# Patient Record
Sex: Female | Born: 1970 | Race: White | Hispanic: No | Marital: Single | Smoking: Former smoker
Health system: Southern US, Community
[De-identification: ages and names within clinical notes are randomized; demographics above are authoritative.]

## PROBLEM LIST (undated history)

## (undated) DIAGNOSIS — E039 Hypothyroidism, unspecified: Secondary | ICD-10-CM

## (undated) HISTORY — DX: Hypothyroidism, unspecified: E03.9

## (undated) HISTORY — PX: THYROIDECTOMY: SHX17

---

## 2003-11-20 ENCOUNTER — Ambulatory Visit (HOSPITAL_BASED_OUTPATIENT_CLINIC_OR_DEPARTMENT_OTHER): Admission: RE | Admit: 2003-11-20 | Discharge: 2003-11-20 | Payer: Self-pay | Admitting: Orthopedic Surgery

## 2010-01-29 ENCOUNTER — Encounter: Admission: RE | Admit: 2010-01-29 | Discharge: 2010-01-29 | Payer: Self-pay | Admitting: Orthopedic Surgery

## 2010-06-29 ENCOUNTER — Encounter: Payer: Self-pay | Admitting: Orthopedic Surgery
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
## 2011-05-28 IMAGING — CT CT EXTREM LOW W/O CM*R*
1 of 5 series · 2 of 14 positions shown, 3 images · non-contrast
Comparison: Report of right ankle MRI dated 12/03/2009

CLINICAL DATA: Right ankle pain.  Previous open reduction and

CT OF THE RIGHT ANKLE WITHOUT CONTRAST
TECHNIQUE: Multidetector CT imaging of the right ankle was
performed according to the standard protocol without intravenous
contrast. Multiplanar CT image reconstructions were also generated.

[Series 4: lower ext detail · axial · 0.31mm/px · z∈[-6,+56]mm · 2 of 77 slices shown, 3 images]
[im 26/77  soft-tissue]
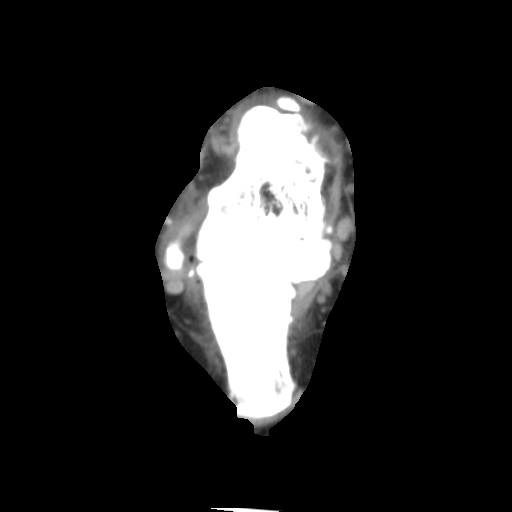
[im 26/77  bone]
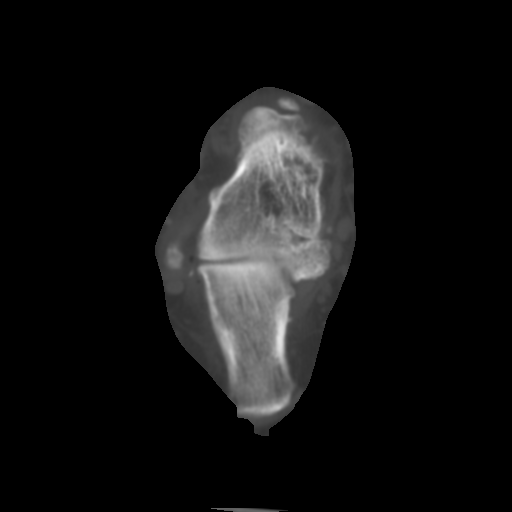
[im 51/77  bone]
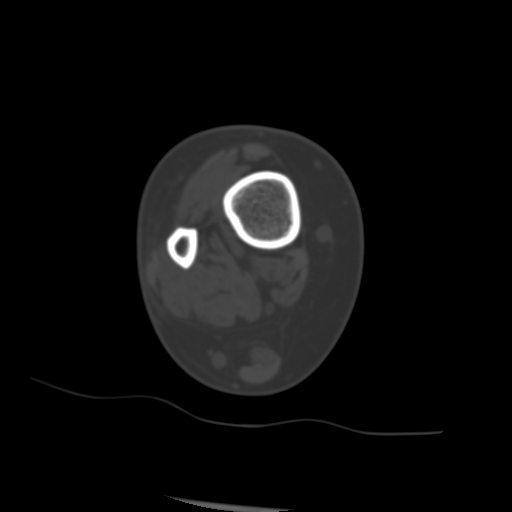

[2 of 14 positions shown; findings below may reference images not displayed]

FINDINGS: The scan demonstrates severe osteoarthritis of the
posterior and middle subtalar joint facets with prominent spur
formation, joint space narrowing, and subchondral sclerosis.  There
also moderate osteoarthritic changes of the small anterior facets
of the subtalar joint.

There is prominent dorsal spurring of the calcaneus at the
calcaneonavicular joint.

There is severe osteoarthritis of the talonavicular joint.  There
are prominent osteophytes on the dorsal aspect of the talonavicular
joint.

There are small calcifications consistent with loose bodies
adjacent to the posterior tibial and flexor digitorum longus
tendons at the posterior medial aspect of the distal tibia.  There
is also a tiny calcification adjacent to the peroneal tendons at
the posterior aspect of the lateral malleolus.

The Achilles tendon is normal.  There is a prominent enthesophyte
at the plantar fascia origin.

There is slight osteoarthritis of the posterior aspect of the
tibiotalar joint primarily associated with the prominent posterior
process of the talus which could lead to impingement with marked
plantar flexion.  The remainder ankle joint demonstrates no
significant joint space narrowing.
IMPRESSION: 1.  Severe osteoarthritis of the subtalar joint.
2.  Severe osteoarthritis of the talonavicular joint with mild
osteoarthritis of the calcaneal cuboid joint.
3.  Loose bodies in the posterior medial aspect of the ankle joint
adjacent to the posterior tibial and flexor digitorum longus
tendons.

## 2015-02-28 ENCOUNTER — Encounter: Payer: Self-pay | Admitting: "Endocrinology

## 2015-03-11 ENCOUNTER — Ambulatory Visit (INDEPENDENT_AMBULATORY_CARE_PROVIDER_SITE_OTHER): Payer: BLUE CROSS/BLUE SHIELD | Admitting: "Endocrinology

## 2015-03-11 ENCOUNTER — Encounter: Payer: Self-pay | Admitting: "Endocrinology

## 2015-03-11 VITALS — BP 134/83 | HR 83 | Ht 66.0 in | Wt 237.0 lb

## 2015-03-11 DIAGNOSIS — E039 Hypothyroidism, unspecified: Secondary | ICD-10-CM | POA: Diagnosis not present

## 2015-03-11 DIAGNOSIS — E890 Postprocedural hypothyroidism: Secondary | ICD-10-CM

## 2015-03-11 DIAGNOSIS — R7302 Impaired glucose tolerance (oral): Secondary | ICD-10-CM

## 2015-03-11 NOTE — Progress Notes (Signed)
Subjective:    Patient ID: Leah Dawson, female    DOB: [birth year],    Past Medical History  Diagnosis Date  . Hypothyroidism    Past Surgical History  Procedure Laterality Date  . Thyroidectomy     Social History   Social History  . Marital Status: Single    Spouse Name: N/A  . Number of Children: N/A  . Years of Education: N/A   Social History Main Topics  . Smoking status: Former Research scientist (life sciences)  . Smokeless tobacco: None  . Alcohol Use: No  . Drug Use: No  . Sexual Activity: Not Asked   Other Topics Concern  . None   Social History Narrative  . None   Outpatient Encounter Prescriptions as of 03/11/2015  Medication Sig  . ALPRAZolam (XANAX) 0.5 MG tablet Take 0.5 mg by mouth at bedtime as needed for anxiety.  . calcitRIOL (ROCALTROL) 0.5 MCG capsule Take 0.5 mcg by mouth daily.  . calcium acetate (PHOSLO) 667 MG capsule Take 667 mg by mouth 3 (three) times daily with meals.  Marland Kitchen levothyroxine (SYNTHROID, LEVOTHROID) 175 MCG tablet Take 175 mcg by mouth daily before breakfast.  . metFORMIN (GLUCOPHAGE) 500 MG tablet Take 500 mg by mouth daily with breakfast.   No facility-administered encounter medications on file as of 03/11/2015.   ALLERGIES: No Known Allergies VACCINATION STATUS:  There is no immunization history on file for this patient.  HPI Leah Dawson is a 44 year old female with medical history as above. She returned for a follow-up visit. She is being followed up for postsurgical hypothyroidism, prediabetes, hypocalcemia, obesity. She reports compliance to her medications. No complaint of interval problems. She is taking her PhosLo 667 mg 3 times a day with meals, levothyroxine 150 g by mouth every morning, metformin 500 mg by mouth every day. She has steady weight. Denies heat or cold intolerance.  Review of Systems  Constitutional: no weight gain/loss, no fatigue, no subjective hyperthermia/hypothermia Eyes: no blurry vision, no xerophthalmia ENT: no  sore throat, no nodules palpated in throat, no dysphagia/odynophagia, no hoarseness Cardiovascular: no CP/SOB/palpitations/leg swelling Respiratory: no cough/SOB Gastrointestinal: no N/V/D/C Musculoskeletal: no muscle/joint aches Skin: no rashes Neurological: no tremors/numbness/tingling/dizziness Psychiatric: no depression/anxiety Objective:    BP 134/83 mmHg  Pulse 83  Ht 5' 6"  (1.676 m)  Wt 237 lb (107.502 kg)  BMI 38.27 kg/m2  SpO2 95%  Wt Readings from Last 3 Encounters:  03/11/15 237 lb (107.502 kg)  12/05/14 236 lb (107.049 kg)    Physical Exam Constitutional: overweight, in NAD Eyes: PERRLA, EOMI, no exophthalmos ENT: moist mucous membranes, no thyromegaly, no cervical lymphadenopathy Neck: post thyroidectomy scar. Cardiovascular: RRR, No MRG Respiratory: CTA B Gastrointestinal: abdomen soft, NT, ND, BS+ Musculoskeletal: no deformities, strength intact in all 4 Skin: moist, warm, no rashes Neurological: no tremor with outstretched hands, DTR normal in all 4       Assessment & Plan:   1. Postsurgical hypothyroidism On 03/04/2015 her TSH was 0.253 slightly suppressed free T4 is better at 1.78. This is indicated. Slight over placement, however, I would not decrease her Synthroid dose given the fact that we are beginning the cold season.  I advised her to continue Synthroid 150 g by mouth every a.m. She is made aware of the fact that she will need thyroid hormone replacement for life. Out of pains of following labs before her next visit: - T4, free - TSH  2. Hypocalcemia On 03/04/2015, her  phosphorus was high normal at 4.9  mg per dl. During her last visit her calcium was 9.0 and PTH was 8. I advised her to continue her PhosLo 667 mg 3 times a day before meals. She'll also continue current calcitriol 0.5 g by mouth twice a day.  3. Impaired glucose tolerance During her last visit A1c was 6.2%. Consistent with prediabetes. She will continue on metformin 500 mg by  mouth daily. Detailed carbohydrate restriction and exercise counseling is provided to patient. I will obtain the following labs before her next visit: - CMP14+EGFR - Hemoglobin A1c    Follow up plan: Return in about 3 months (around 06/11/2015) for underactive thyroid, hypocalcemia, prediabetes.Glade Lloyd, MD Phone: [phone]  Fax: [phone]   03/11/2015, 4:02 PM

## 2015-04-06 ENCOUNTER — Other Ambulatory Visit: Payer: Self-pay | Admitting: "Endocrinology

## 2015-04-12 ENCOUNTER — Other Ambulatory Visit: Payer: Self-pay | Admitting: "Endocrinology

## 2015-04-23 ENCOUNTER — Other Ambulatory Visit: Payer: Self-pay | Admitting: "Endocrinology

## 2015-06-13 ENCOUNTER — Other Ambulatory Visit: Payer: Self-pay | Admitting: "Endocrinology

## 2015-06-13 DIAGNOSIS — E890 Postprocedural hypothyroidism: Secondary | ICD-10-CM

## 2015-06-13 DIAGNOSIS — R7309 Other abnormal glucose: Secondary | ICD-10-CM

## 2015-06-17 ENCOUNTER — Ambulatory Visit: Payer: BLUE CROSS/BLUE SHIELD | Admitting: "Endocrinology

## 2015-07-08 ENCOUNTER — Other Ambulatory Visit: Payer: Self-pay | Admitting: "Endocrinology

## 2015-08-05 ENCOUNTER — Other Ambulatory Visit: Payer: Self-pay | Admitting: "Endocrinology

## 2015-08-19 ENCOUNTER — Encounter: Payer: Self-pay | Admitting: "Endocrinology

## 2015-08-19 ENCOUNTER — Ambulatory Visit (INDEPENDENT_AMBULATORY_CARE_PROVIDER_SITE_OTHER): Payer: BLUE CROSS/BLUE SHIELD | Admitting: "Endocrinology

## 2015-08-19 VITALS — BP 134/78 | HR 90 | Ht 66.0 in | Wt 231.0 lb

## 2015-08-19 DIAGNOSIS — R7302 Impaired glucose tolerance (oral): Secondary | ICD-10-CM | POA: Diagnosis not present

## 2015-08-19 DIAGNOSIS — E890 Postprocedural hypothyroidism: Secondary | ICD-10-CM | POA: Diagnosis not present

## 2015-08-19 MED ORDER — CALCITRIOL 0.5 MCG PO CAPS: 0.5000 ug | ORAL_CAPSULE | Freq: Every day | ORAL | Status: DC

## 2015-08-19 MED ORDER — LEVOTHYROXINE SODIUM 137 MCG PO TABS: 137.0000 ug | ORAL_TABLET | Freq: Every day | ORAL | Status: DC

## 2015-08-19 NOTE — Progress Notes (Signed)
Subjective:    Patient ID: Leah Dawson, female    DOB: [birth year],    Past Medical History  Diagnosis Date  . Hypothyroidism    Past Surgical History  Procedure Laterality Date  . Thyroidectomy     Social History   Social History  . Marital Status: Single    Spouse Name: N/A  . Number of Children: N/A  . Years of Education: N/A   Social History Main Topics  . Smoking status: Former Games developer  . Smokeless tobacco: None  . Alcohol Use: No  . Drug Use: No  . Sexual Activity: Not Asked   Other Topics Concern  . None   Social History Narrative   Outpatient Encounter Prescriptions as of 08/19/2015  Medication Sig  . ALPRAZolam (XANAX) 0.5 MG tablet Take 0.5 mg by mouth at bedtime as needed for anxiety.  . calcitRIOL (ROCALTROL) 0.5 MCG capsule Take 1 capsule (0.5 mcg total) by mouth daily.  . calcium acetate (PHOSLO) 667 MG capsule TAKE ONE CAPSULE BY MOUTH THREE TIMES DAILY WITH  BREAKFAST  LUNCH  AND  SUPPER  . levothyroxine (SYNTHROID, LEVOTHROID) 137 MCG tablet Take 1 tablet (137 mcg total) by mouth daily before breakfast.  . metFORMIN (GLUCOPHAGE) 500 MG tablet TAKE ONE TABLET BY MOUTH ONCE DAILY  . [DISCONTINUED] calcitRIOL (ROCALTROL) 0.5 MCG capsule Take 0.5 mcg by mouth daily.  . [DISCONTINUED] SYNTHROID 150 MCG tablet TAKE ONE TABLET BY MOUTH EVERY MORNING   No facility-administered encounter medications on file as of 08/19/2015.   ALLERGIES: No Known Allergies VACCINATION STATUS:  There is no immunization history on file for this patient.  HPI Leah Dawson is a 45 year old female with medical history as above. She returned for a follow-up visit. 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 She reports compliance to her medications. She feels anxious , since last visit.  She is taking her PhosLo 667 mg 3 times a day with meals, levothyroxine 150 g  by mouth every morning, metformin 500 mg by mouth every day. She has lost 6 lbs since last visit.  Denies heat or cold intolerance.  Review of Systems  Constitutional: + weight loss, no fatigue, no subjective hyperthermia/hypothermia Eyes: no blurry vision, no xerophthalmia ENT: no sore throat, no nodules palpated in throat, no dysphagia/odynophagia, no hoarseness Cardiovascular: no CP/SOB/palpitations/leg swelling Respiratory: no cough/SOB Gastrointestinal: no N/V/D/C Musculoskeletal: no muscle/joint aches Skin: no rashes Neurological: no tremors/numbness/tingling/dizziness Psychiatric: no depression/anxiety Objective:    BP 134/78 mmHg  Pulse 90  Ht  (1.676 m)  Wt 231 lb (104.781 kg)  BMI 37.30 kg/m2  SpO2 99%  Wt Readings from Last 3 Encounters:  08/19/15 231 lb (104.781 kg)  03/11/15 237 lb (107.502 kg)  12/05/14 236 lb (107.049 kg)    Physical Exam Constitutional: overweight, in NAD Eyes: PERRLA, EOMI, no exophthalmos ENT: moist mucous membranes, thyroidectomy scar on anterior lower neck, no cervical lymphadenopathy Neck: post thyroidectomy scar. Cardiovascular: RRR, No MRG Respiratory: CTA B Gastrointestinal: abdomen soft, NT, ND, BS+ Musculoskeletal: no deformities, strength intact in all 4 Skin: moist, warm, no rashes Neurological: no tremor with outstretched hands, DTR normal in all 4   Assessment & Plan:   1. Postsurgical hypothyroidism She has clinical and biochemical evidence of over-replacement.  I will proceed to lower her Synthroid to 137 mcg po qam.   She is made aware of the fact that she will need thyroid hormone replacement for life.  2. Hypocalcemia On 03/04/2015, her  phosphorus was high normal at 5.2 mg per dl. During her last visit her calcium was 9.0 and PTH was 8. I advised her to continue her PhosLo 667 mg 3 times a day before meals. She'll also continue current calcitriol 0.5 g by mouth q day.  3. Impaired glucose tolerance During  her last visit A1c was 6.2%. Consistent with prediabetes. She will continue on metformin 500 mg by mouth daily. Detailed carbohydrate restriction and exercise counseling is provided to patient. I will obtain the following labs before her next visit:   Follow up plan: Return in about 6 months (around 02/19/2016) for follow up with pre-visit labs, underactive thyroid, Vitamin D deficiency.  Leah LunchGebre Atoya Andrew, MD Phone: [phone]  Fax: [phone]   08/19/2015, 3:11 PM

## 2015-10-07 ENCOUNTER — Other Ambulatory Visit: Payer: Self-pay | Admitting: "Endocrinology

## 2015-12-12 ENCOUNTER — Other Ambulatory Visit: Payer: Self-pay | Admitting: "Endocrinology

## 2016-01-06 ENCOUNTER — Other Ambulatory Visit: Payer: Self-pay | Admitting: "Endocrinology

## 2016-01-10 ENCOUNTER — Other Ambulatory Visit: Payer: Self-pay | Admitting: "Endocrinology

## 2016-02-18 ENCOUNTER — Other Ambulatory Visit: Payer: Self-pay | Admitting: "Endocrinology

## 2016-02-19 LAB — VITAMIN D 25 HYDROXY (VIT D DEFICIENCY, FRACTURES): VIT D 25 HYDROXY: 31.4 ng/mL (ref 30.0–100.0)

## 2016-02-19 LAB — HGB A1C W/O EAG: HEMOGLOBIN A1C: 6.2 % — AB (ref 4.8–5.6)

## 2016-02-19 LAB — PTH, INTACT AND CALCIUM
Calcium: 8.3 mg/dL — ABNORMAL LOW (ref 8.7–10.2)
PTH: 15 pg/mL (ref 15–65)

## 2016-02-19 LAB — T4, FREE: Free T4: 1.84 ng/dL — ABNORMAL HIGH (ref 0.82–1.77)

## 2016-02-19 LAB — TSH: TSH: 0.645 u[IU]/mL (ref 0.450–4.500)

## 2016-02-24 ENCOUNTER — Encounter: Payer: Self-pay | Admitting: "Endocrinology

## 2016-02-24 ENCOUNTER — Ambulatory Visit (INDEPENDENT_AMBULATORY_CARE_PROVIDER_SITE_OTHER): Payer: BLUE CROSS/BLUE SHIELD | Admitting: "Endocrinology

## 2016-02-24 VITALS — BP 141/82 | HR 95 | Ht 66.0 in | Wt 234.0 lb

## 2016-02-24 DIAGNOSIS — E890 Postprocedural hypothyroidism: Secondary | ICD-10-CM | POA: Diagnosis not present

## 2016-02-24 DIAGNOSIS — R7302 Impaired glucose tolerance (oral): Secondary | ICD-10-CM

## 2016-02-24 NOTE — Progress Notes (Signed)
Subjective:    Patient ID: Leah Dawson, female    DOB: 09/25/1970,    Past Medical History:  Diagnosis Date  . Hypothyroidism    Past Surgical History:  Procedure Laterality Date  . THYROIDECTOMY     Social History   Social History  . Marital status: Single    Spouse name: N/A  . Number of children: N/A  . Years of education: N/A   Social History Main Topics  . Smoking status: Former Games developermoker  . Smokeless tobacco: Never Used  . Alcohol use No  . Drug use: No  . Sexual activity: Not Asked   Other Topics Concern  . None   Social History Narrative  . None   Outpatient Encounter Prescriptions as of 02/24/2016  Medication Sig  . ALPRAZolam (XANAX) 0.5 MG tablet Take 0.5 mg by mouth at bedtime as needed for anxiety.  . calcitRIOL (ROCALTROL) 0.5 MCG capsule Take 1 capsule (0.5 mcg total) by mouth daily.  . calcium acetate (PHOSLO) 667 MG capsule TAKE ONE CAPSULE BY MOUTH THREE TIMES A DAY WITH BREAKFAST, LUNCH, AND SUPPER  . metFORMIN (GLUCOPHAGE) 500 MG tablet TAKE ONE TABLET BY MOUTH ONCE DAILY  . SYNTHROID 137 MCG tablet TAKE ONE TABLET BY MOUTH DAILY BEFORE BREAKFAST   No facility-administered encounter medications on file as of 02/24/2016.    ALLERGIES: No Known Allergies VACCINATION STATUS:  There is no immunization history on file for this patient.  HPI Leah Dawson is a 45 year old female with medical history as above. She returned for a follow-up visit. 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 She reports compliance to her medications. She feels better , but feels sluggish .  She is taking her PhosLo 667 mg 3 times a day with meals, levothyroxine 137 g by mouth every morning, metformin 500 mg by mouth every day.  She did not Succeed in weight loss this time .  Denies heat or cold intolerance.  Review of Systems  Constitutional: + Steady  weight, + fatigue, no subjective hyperthermia/hypothermia Eyes: no blurry vision, no xerophthalmia ENT: no sore throat, no nodules palpated in throat, no dysphagia/odynophagia, no hoarseness Cardiovascular: no CP/SOB/palpitations/leg swelling Respiratory: no cough/SOB Gastrointestinal: no N/V/D/C Musculoskeletal: no muscle/joint aches Skin: no rashes Neurological: no tremors/numbness/tingling/dizziness Psychiatric: no depression/anxiety Objective:    BP (!) 141/82   Pulse 95   Ht 5\' 6"  (1.676 m)   Wt 234 lb (106.1 kg)   BMI 37.77 kg/m   Wt Readings from Last 3 Encounters:  02/24/16 234 lb (106.1 kg)  08/19/15 231 lb (104.8 kg)  03/11/15 237 lb (107.5 kg)    Physical Exam Constitutional: overweight, in NAD Eyes: PERRLA, EOMI, no exophthalmos ENT: moist mucous membranes, thyroidectomy scar on anterior lower neck, no cervical lymphadenopathy Neck: post thyroidectomy scar. Cardiovascular: RRR, No MRG Respiratory: CTA B Gastrointestinal: abdomen soft, NT, ND, BS+ Musculoskeletal: no deformities, strength intact in all 4 Skin: moist, warm, no rashes Neurological: no tremor with outstretched hands, DTR normal in all 4  Recent Results (from the past 2160 hour(s))  PTH, intact and calcium     Status: Abnormal   Collection Time: 02/18/16 11:25 AM  Result Value Ref Range   Calcium 8.3 (L) 8.7 - 10.2 mg/dL   PTH 15 15 - 65 pg/mL   PTH Comment     Comment: Interpretation  Intact PTH    Calcium                                 (pg/mL)      (mg/dL) Normal                          15 - 65     8.6 - 10.2 Primary Hyperparathyroidism         >65          >10.2 Secondary Hyperparathyroidism       >65          <10.2 Non-Parathyroid Hypercalcemia       <65          >10.2 Hypoparathyroidism                  <15          < 8.6 Non-Parathyroid Hypocalcemia    15 - 65          < 8.6   Hgb A1c w/o eAG     Status: Abnormal   Collection Time: 02/18/16 11:25 AM  Result Value Ref  Range   Hgb A1c MFr Bld 6.2 (H) 4.8 - 5.6 %    Comment:          Pre-diabetes: 5.7 - 6.4          Diabetes: >6.4          Glycemic control for adults with diabetes: <7.0   T4, free     Status: Abnormal   Collection Time: 02/18/16 11:25 AM  Result Value Ref Range   Free T4 1.84 (H) 0.82 - 1.77 ng/dL  TSH     Status: None   Collection Time: 02/18/16 11:25 AM  Result Value Ref Range   TSH 0.645 0.450 - 4.500 uIU/mL  VITAMIN D 25 Hydroxy (Vit-D Deficiency, Fractures)     Status: None   Collection Time: 02/18/16 11:25 AM  Result Value Ref Range   Vit D, 25-Hydroxy 31.4 30.0 - 100.0 ng/mL    Comment: Vitamin D deficiency has been defined by the Institute of Medicine and an Endocrine Society practice guideline as a level of serum 25-OH vitamin D less than 20 ng/mL (1,2). The Endocrine Society went on to further define vitamin D insufficiency as a level between 21 and 29 ng/mL (2). 1. IOM (Institute of Medicine). 2010. Dietary reference    intakes for calcium and D. [place]: The    [employer]. 2. Holick MF, Binkley , Bischoff-Ferrari HA, et al.    Evaluation, treatment, and prevention of vitamin D    deficiency: an Endocrine Society clinical practice    guideline. JCEM. 2011 Jul; 96(7):1911-30.    Assessment & Plan:   1. Postsurgical hypothyroidism She has biochemical evidence of slight over-replacement.  - Clinically she is euthyroid. I will not change her dose of levothyroxine at this time.  I will proceed with Synthroid  137 mcg po qam.  - We discussed about correct intake of levothyroxine, at fasting, with water, separated by at least 30 minutes from breakfast, and separated by more than 4 hours from calcium, iron, multivitamins, acid reflux medications (PPIs). -Patient is made aware of the fact that thyroid hormone replacement is needed for life, dose to be adjusted by periodic monitoring of thyroid function tests.  2. Hypocalcemia - Her calcium is 8.3  along with  PTH of 15. -  I advised her to continue her PhosLo 667 mg 3 times a day with meals. She'll also continue current calcitriol 0.5 g by mouth q day.  3. Impaired glucose tolerance During her last visit A1c was 6.2%. Consistent with prediabetes. She will continue on metformin 500 mg by mouth daily. Detailed carbohydrate restriction and exercise counseling is provided to patient. This will also help with her weight. 4. Obesity: I discussed her option of qsymia, which has a high co-pay, that  she can not afford at this time. She promises to maximize her personal effort to lose weight.  Follow up plan: Return in about 6 months (around 08/23/2016) for follow up with pre-visit labs.  Marquis Lunch, MD Phone: [phone]  Fax: [phone]   02/24/2016, 2:21 PM

## 2016-03-04 ENCOUNTER — Other Ambulatory Visit: Payer: Self-pay | Admitting: "Endocrinology

## 2016-03-09 ENCOUNTER — Telehealth: Payer: Self-pay | Admitting: "Endocrinology

## 2016-03-09 MED ORDER — CALCITRIOL 0.5 MCG PO CAPS: 0.5000 ug | ORAL_CAPSULE | Freq: Every day | ORAL | 6 refills | Status: DC

## 2016-03-09 NOTE — Telephone Encounter (Signed)
Needs refill on Calcitriol

## 2016-06-08 ENCOUNTER — Other Ambulatory Visit: Payer: Self-pay | Admitting: "Endocrinology

## 2016-07-11 ENCOUNTER — Other Ambulatory Visit: Payer: Self-pay | Admitting: "Endocrinology

## 2016-07-14 ENCOUNTER — Ambulatory Visit (INDEPENDENT_AMBULATORY_CARE_PROVIDER_SITE_OTHER): Payer: BLUE CROSS/BLUE SHIELD | Admitting: Internal Medicine

## 2016-07-14 ENCOUNTER — Encounter: Payer: Self-pay | Admitting: Internal Medicine

## 2016-07-14 VITALS — BP 132/84 | HR 97 | Ht 65.0 in | Wt 240.0 lb

## 2016-07-14 DIAGNOSIS — E890 Postprocedural hypothyroidism: Secondary | ICD-10-CM

## 2016-07-14 DIAGNOSIS — R5382 Chronic fatigue, unspecified: Secondary | ICD-10-CM

## 2016-07-14 DIAGNOSIS — E559 Vitamin D deficiency, unspecified: Secondary | ICD-10-CM

## 2016-07-14 DIAGNOSIS — R7303 Prediabetes: Secondary | ICD-10-CM

## 2016-07-14 LAB — BASIC METABOLIC PANEL WITH GFR
BUN: 12 mg/dL (ref 7–25)
CO2: 25 mmol/L (ref 20–31)
Calcium: 8.2 mg/dL — ABNORMAL LOW (ref 8.6–10.2)
Chloride: 105 mmol/L (ref 98–110)
Creat: 0.78 mg/dL (ref 0.50–1.10)
GLUCOSE: 82 mg/dL (ref 65–99)
POTASSIUM: 4.3 mmol/L (ref 3.5–5.3)
SODIUM: 141 mmol/L (ref 135–146)

## 2016-07-14 LAB — PHOSPHORUS: PHOSPHORUS: 4.4 mg/dL (ref 2.3–4.6)

## 2016-07-14 LAB — T4, FREE: FREE T4: 0.86 ng/dL (ref 0.60–1.60)

## 2016-07-14 LAB — HEMOGLOBIN A1C: HEMOGLOBIN A1C: 6.1 % (ref 4.6–6.5)

## 2016-07-14 LAB — VITAMIN B12: VITAMIN B 12: 618 pg/mL (ref 211–911)

## 2016-07-14 LAB — TSH: TSH: 1 u[IU]/mL (ref 0.35–4.50)

## 2016-07-14 LAB — VITAMIN D 25 HYDROXY (VIT D DEFICIENCY, FRACTURES): VITD: 19.84 ng/mL — ABNORMAL LOW (ref 30.00–100.00)

## 2016-07-14 LAB — MAGNESIUM: Magnesium: 2.1 mg/dL (ref 1.5–2.5)

## 2016-07-14 NOTE — Progress Notes (Signed)
Patient ID: Leah Dawson, female   DOB: [birth year], 46 y.o.   MRN: [id number]    HPI  Leah Dawson is a 46 y.o.-year-old female, referred by her ObGyn Dr., Dr. Wiliam Ke, for management of postsurgical hypothyroidism and hypocalcemia. She saw Dr. Fransico Him before.  Pt. has been dx with hypothyroidism after her completion thyroidectomy + lymph node dissection (for enlarging thyroid nodules) in 08/2014. She previously had lobectomy in 2006.  She started on Synthroid d.a.w. In 2007 >> she decreased the dose from 150 >> 137 mcg in Summer 2017.  She takes the thyroid hormone: - fasting - with water - separated by >60 min from b'fast  - + calcium and Phoslo - 3h after LT4 - iron, PPIs, multivitamins  Using B12, just started food enzymes.  I reviewed pt's thyroid tests:  Lab Results  Component Value Date   TSH 0.645 02/18/2016   FREET4 1.84 (H) 02/18/2016  06/18/2014: TSH 8.59  Pt describes: - + weight gain - + fatigue - + cold intolerance, + hot flushes - depression - constipation - dry skin - hair loss  Pt denies feeling nodules in neck, + occasional hoarseness, no dysphagia/odynophagia, SOB with lying down.  She has + FH of thyroid disorders in: father (hypothyroidism), PGF (hyperthyroidism). No FH of thyroid cancer.  No h/o radiation tx to head or neck. No recent use of iodine supplements.  She also has postsurgical hypocalcemia: - She is on:  PhosLo (calcium acetate) 667 mg 3x a day  Calcitriol 0.5 g daily  She stopped vitamin D 1000 IU daily a long time ago.  Calcium levels reviewed: Lab Results  Component Value Date   PTH 15 02/18/2016   PTH Comment 02/18/2016   CALCIUM 8.3 (L) 02/18/2016   Her vitamin D was normal in 02/2016.  She tested positive for prediabetes: - Last HbA1c was: Lab Results  Component Value Date   HGBA1C 6.2 (H) 02/18/2016   She started Metformin 500 mg daily with lunch in the last 1 year.  Meals: - Breakfast: Skips or egg sandwiches -  Lunch: Salad with chicken, sandwich - Dinner: Pizza, salad, pasta, potatoes - Snacks: 1: Pack of nabs or banana  No exercise.  ROS: Constitutional: + see HPI, + poor sleep Eyes: + blurry vision, no xerophthalmia ENT: no sore throat, no nodules palpated in throat, no dysphagia/odynophagia, + intermittent hoarseness after her last surgery, + hypoacusis Cardiovascular: + Anxiety related CP/no SOB/palpitations/leg swelling Respiratory: + cough/no SOB Gastrointestinal: + All: Heartburn/ N/V/D Musculoskeletal: + Both:, + Hair loss, + itching muscle/joint aches Skin: no rashes Neurological: no tremors/numbness/tingling/dizziness Psychiatric:+ both: depression/anxiety  Past Medical History:  Diagnosis Date  . Hypothyroidism    Past Surgical History:  Procedure Laterality Date  . Partial thyroidectomy Completion thyroidectomy   2009 2016  Also: - Hand surgery 1997 - C-section in 2002 - Right ovary removed in 2003 - Right ankle surgery in 2006  Social History   Social History  . Marital status: Married    Spouse name: N/A  . Number of children: 1   Occupational History  . Hair stylist (self-employed)    Social History Main Topics  . Smoking status: Former Games developer, Quit in 2016   . Smokeless tobacco: Never Used  . Alcohol use No  . Drug use: No   Current Outpatient Prescriptions on File Prior to Visit  Medication Sig Dispense Refill  . ALPRAZolam (XANAX) 0.5 MG tablet Take 0.5 mg by mouth at bedtime as needed for anxiety.    Marland Kitchen  calcitRIOL (ROCALTROL) 0.5 MCG capsule Take 1 capsule (0.5 mcg total) by mouth daily. 30 capsule 6  . calcium acetate (PHOSLO) 667 MG capsule TAKE ONE CAPSULE BY MOUTH THREE TIMES DAILY WITH  BREAKFAST,  LUNCH,  AND  SUPPER 90 capsule 1  . metFORMIN (GLUCOPHAGE) 500 MG tablet TAKE ONE TABLET BY MOUTH ONCE DAILY 30 tablet 4  . SYNTHROID 137 MCG tablet TAKE ONE TABLET BY MOUTH DAILY BEFORE BREAKFAST 30 tablet 3   No current facility-administered  medications on file prior to visit.    No Known Allergies Family History  Problem Relation Age of Onset  . Thyroid disease Father   . Cancer Maternal Grandmother   . Cancer Paternal Grandmother   . Thyroid disease Paternal Grandfather   Also history of hypertension in brother and father.  She also has a history of hypertension, heartburn.  PE: BP 132/84 (BP Location: Left Arm, Patient Position: Sitting)   Pulse 97   Ht 5\' 5"  (1.651 m)   Wt 240 lb (108.9 kg)   LMP 07/03/2016   SpO2 97%   BMI 39.94 kg/m  Wt Readings from Last 3 Encounters:  07/14/16 240 lb (108.9 kg)  02/24/16 234 lb (106.1 kg)  08/19/15 231 lb (104.8 kg)   Constitutional: Obese, in NAD Eyes: PERRLA, EOMI, no exophthalmos ENT: moist mucous membranes, thyroidectomy scar healed, no neck masses palpated., no cervical lymphadenopathy Cardiovascular: Tachycardia, regular rhythm, No MRG Respiratory: CTA B Gastrointestinal: abdomen soft, NT, ND, BS+ Musculoskeletal: no deformities, strength intact in all 4; Chvostek sign negative bilaterally  Skin: moist, warm, no rashes Neurological: no tremor with outstretched hands, DTR normal in all 4  ASSESSMENT: 1.  Postsurgical Hypothyroidism  2. Postsurgical hypocalcemia  3. Prediabetes  4. Fatigue   PLAN:  1. Patient with long-standing hypothyroidism, on levothyroxine therapy.She is on Synthroid DAW 137 mcg. She feels that since the dose was decreased from 150 g, she has felt sluggish, more depressed, and has not been able to lose weight. She would be very interested to increase the dose back, if possible. - We discussed about correct intake of levothyroxine, fasting, with water, separated by at least 30 minutes from breakfast, and separated by more than 4 hours from calcium, iron, multivitamins, acid reflux medications (PPIs). She is taking PhosLo 3 hours after Synthroid, which I believe is ok. - will check thyroid tests today: TSH, free T4 - If labs today are  abnormal, she will need to return in ~6 weeks for repeat labs - Otherwise, I will see her back in 4 months  2. Postsurgical hypocalcemia - She has an extensive neck surgery history with subsequent hypoparathyroidism and hypocalcemia. She does have some parathyroid production, though, per review of labs from 02/2016 - We will continue the calcitriol and the PhosLo but I would like to check these levels today. - We will also check a vitamin D level since this will need to be repeated if low  - We may also need to add calcium tablets - We discussed about goals for treatment: Phosphorus in the normal range and calcium in the low normal range or slightly lower than the lower limit of normal. - We did discuss about physical manifestations of hypocalcemia and she does mention numbness in her hands but not necessarily cramping or perioral numbness. The Chvostek sign today was negative.   3. Prediabetes - Discussed about the fact that this is a reversible condition, as opposed to diabetes, which is usually reversible - She is aware  about the need to lose weight and improve her diet. I suggested a referral to nutrition, and she will think about this. I also may specific suggestions about healthy food substitutions. - We'll continue the metformin but I advised her to move it at night. She may need a higher dose, but will await her new HbA1c to decide about this. - We'll check an HbA1c today.  4. Fatigue  - She started B12 supplements but would like to have a level checked - Will check B12 today  Orders Placed This Encounter  Procedures  . T4, free  . TSH  . VITAMIN D 25 Hydroxy (Vit-D Deficiency, Fractures)  . Vitamin D 1,25 dihydroxy  . Parathyroid hormone, intact (no Ca)  . BASIC METABOLIC PANEL WITH GFR  . Magnesium  . Phosphorus  . Hemoglobin A1c  . Vitamin B12   - time spent with the patient and her husband: 1 hour, of which >50% was spent in obtaining information about her symptoms,  reviewing her previous labs, evaluations, and treatments, counseling her about her conditions (please see the discussed topics above), and developing a plan to further investigate and treat them; she and her husband had a number of questions which I addressed.  Component     Latest Ref Rng & Units 07/14/2016          Sodium     135 - 146 mmol/L 141  Potassium     3.5 - 5.3 mmol/L 4.3  Chloride     98 - 110 mmol/L 105  CO2     20 - 31 mmol/L 25  Glucose     65 - 99 mg/dL 82  BUN     7 - 25 mg/dL 12  Creatinine     1.610.50 - 1.10 mg/dL 0.960.78  Calcium     8.6 - 10.2 mg/dL 8.2 (L)  GFR, Est African American     >=60 mL/min >89  GFR, Est Non African American     >=60 mL/min >89  PTH     15 - 65 pg/mL 15  Vitamin D 1, 25 (OH) Total     18 - 72 pg/mL 85 (H)  Vitamin D3 1, 25 (OH)     pg/mL 70  Vitamin D2 1, 25 (OH)     pg/mL 15  Hemoglobin A1C     4.6 - 6.5 % 6.1  T4,Free(Direct)     0.60 - 1.60 ng/dL 0.450.86  TSH     4.090.35 - 8.114.50 uIU/mL 1.00  VITD     30.00 - 100.00 ng/mL 19.84 (L)  Magnesium     1.5 - 2.5 mg/dL 2.1  Phosphorus     2.3 - 4.6 mg/dL 4.4  Vitamin B14B12     782211 - 911 pg/mL 618   TFTs are at goal. We'll continue Synthroid 137 g daily. Calcium is low, at 8.2. Her PTH is inappropriately low. Her calcitriol level is slightly above the upper limit of normal. Phosphorus and magnesium normal. We'll continue the same dose of PhosLo for now. Her vitamin D is very low, and I will suggest to start 5000 units vitamin D daily. I would like to repeat the calcium and the vitamin D in 2 months. I will hold off adding calcium tablets for now, but we may need to in the near future. Vitamin B12 normal  Carlus Pavlovristina Emie Sommerfeld, MD PhD [facility]

## 2016-07-14 NOTE — Patient Instructions (Addendum)
Please continue Synthroid 137 mcg daily.  Take the thyroid hormone every day, with water, at least 30 minutes before breakfast, separated by at least 4 hours from: - acid reflux medications - calcium - iron - multivitamins  Continue:  PhosLo (calcium acetate) 667 mg 3x a day  Calcitriol 0.5 g daily  Metformin 500 mg daily >> try to move this at dinnertime  Please stop at the lab.  Please consider the following ways to cut down carbs and fat and increase fiber and micronutrients in your diet: - substitute whole grain for white bread or pasta - substitute brown rice for white rice - substitute 90-calorie flat bread pieces for slices of bread when possible - substitute sweet potatoes or yams for white potatoes - substitute humus for margarine - substitute tofu for cheese when possible - substitute almond or rice milk for regular milk (would not drink soy milk daily due to concern for soy estrogen influence on breast cancer risk) - substitute dark chocolate for other sweets when possible - substitute water - can add lemon or orange slices for taste - for diet sodas (artificial sweeteners will trick your body that you can eat sweets without getting calories and will lead you to overeating and weight gain in the long run) - do not skip breakfast or other meals (this will slow down the metabolism and will result in more weight gain over time)  - can try smoothies made from fruit and almond/rice milk in am instead of regular breakfast - can also try old-fashioned (not instant) oatmeal made with almond/rice milk in am - order the dressing on the side when eating salad at a restaurant (pour less than half of the dressing on the salad) - eat as little meat as possible - can try juicing, but should not forget that juicing will get rid of the fiber, so would alternate with eating raw veg./fruits or drinking smoothies - use as little oil as possible, even when using olive oil - can dress a salad  with a mix of balsamic vinegar and lemon juice, for e.g. - use agave nectar, stevia sugar, or regular sugar rather than artificial sweateners - steam or broil/roast veggies  - snack on veggies/fruit/nuts (unsalted, preferably) when possible, rather than processed foods - reduce or eliminate aspartame in diet (it is in diet sodas, chewing gum, etc) Read the labels!  Try to read Dr. Katherina RightNeal Barnard's book: "Program for Reversing Diabetes" for other ideas for healthy eating.

## 2016-07-15 LAB — PARATHYROID HORMONE, INTACT (NO CA): PTH: 15 pg/mL (ref 15–65)

## 2016-07-17 ENCOUNTER — Encounter: Payer: Self-pay | Admitting: Internal Medicine

## 2016-07-17 ENCOUNTER — Other Ambulatory Visit: Payer: Self-pay | Admitting: Internal Medicine

## 2016-07-17 LAB — VITAMIN D 1,25 DIHYDROXY
VITAMIN D 1, 25 (OH) TOTAL: 85 pg/mL — AB (ref 18–72)
VITAMIN D2 1, 25 (OH): 15 pg/mL
VITAMIN D3 1, 25 (OH): 70 pg/mL

## 2016-07-18 ENCOUNTER — Other Ambulatory Visit: Payer: Self-pay | Admitting: "Endocrinology

## 2016-07-20 ENCOUNTER — Other Ambulatory Visit: Payer: Self-pay

## 2016-07-20 MED ORDER — SYNTHROID 137 MCG PO TABS: ORAL_TABLET | ORAL | 3 refills | Status: DC

## 2016-08-24 ENCOUNTER — Ambulatory Visit: Payer: BLUE CROSS/BLUE SHIELD | Admitting: "Endocrinology

## 2016-10-13 ENCOUNTER — Other Ambulatory Visit: Payer: Self-pay | Admitting: "Endocrinology

## 2016-11-12 ENCOUNTER — Ambulatory Visit (INDEPENDENT_AMBULATORY_CARE_PROVIDER_SITE_OTHER): Payer: BLUE CROSS/BLUE SHIELD | Admitting: Internal Medicine

## 2016-11-12 ENCOUNTER — Encounter: Payer: Self-pay | Admitting: Internal Medicine

## 2016-11-12 VITALS — BP 122/84 | HR 92 | Wt 237.0 lb

## 2016-11-12 DIAGNOSIS — R7303 Prediabetes: Secondary | ICD-10-CM

## 2016-11-12 DIAGNOSIS — E559 Vitamin D deficiency, unspecified: Secondary | ICD-10-CM | POA: Diagnosis not present

## 2016-11-12 DIAGNOSIS — E890 Postprocedural hypothyroidism: Secondary | ICD-10-CM

## 2016-11-12 LAB — CALCIUM: Calcium: 9.2 mg/dL (ref 8.4–10.5)

## 2016-11-12 LAB — HEMOGLOBIN A1C: HEMOGLOBIN A1C: 6.1 % (ref 4.6–6.5)

## 2016-11-12 LAB — TSH: TSH: 1.3 u[IU]/mL (ref 0.35–4.50)

## 2016-11-12 LAB — T4, FREE: Free T4: 1.16 ng/dL (ref 0.60–1.60)

## 2016-11-12 MED ORDER — METFORMIN HCL 500 MG PO TABS: 500.0000 mg | ORAL_TABLET | Freq: Every day | ORAL | 11 refills | Status: AC

## 2016-11-12 MED ORDER — CALCITRIOL 0.5 MCG PO CAPS
0.5000 ug | ORAL_CAPSULE | Freq: Every day | ORAL | 11 refills | Status: AC
Start: 2016-11-12 — End: ?

## 2016-11-12 NOTE — Patient Instructions (Addendum)
Please try Restore.   If this is not helping, you may need to see GI.  Continue current doses of Metformin, Vitamin D, Rocaltrol, PhosLo.  Please stop at the lab.

## 2016-11-12 NOTE — Progress Notes (Signed)
Patient ID: Leah Dawson, female   DOB: [birth year], 46 y.o.   MRN: [id number]    HPI  Leah Dawson is a 46 y.o.-year-old female, initially referred by her ObGyn Dr., Dr. Wiliam Ke, for management of postsurgical hypothyroidism and hypocalcemia. She saw Dr. Fransico Him before. Last visit 4 mo ago. Dr. Jeannett Senior = PCP.  Since last visit >> stopepd sweet tea >> she feels better.  Pt. has been dx with hypothyroidism after her completion thyroidectomy + lymph node dissection (for enlarging thyroid nodules) in 08/2014. She previously had lobectomy in 2006.  She started on Synthroid d.a.w. In 2007 >> she decreased the dose from 150 >> 137 mcg in Summer 2017.  She takes LT4: - in am - fasting - at least 30 min from b'fast - no Fe, MVI, PPIs - not on Biotin - + calcium and Phoslo - 4-5h after LT4  I reviewed pt's thyroid tests: Lab Results  Component Value Date   TSH 1.00 07/14/2016   TSH 0.645 02/18/2016   FREET4 0.86 07/14/2016   FREET4 1.84 (H) 02/18/2016  06/18/2014: TSH 8.59  Pt denies: - feeling nodules in neck - hoarseness - dysphagia - choking - SOB with lying down  She has + FH of thyroid disorders in: father (hypothyroidism), PGF (hyperthyroidism).No FH of thyroid cancer. No h/o radiation tx to head or neck.  No seaweed or kelp. No recent contrast studies. No herbal supplements. No Biotin use. No recent steroids use.   She also has postsurgical hypocalcemia: - She is on:  PhosLo (calcium acetate) 667 mg 3x a day  Calcitriol 0.5 g daily  Calcium levels reviewed: Lab Results  Component Value Date   PTH 15 07/14/2016   PTH 15 02/18/2016   PTH Comment 02/18/2016   CALCIUM 8.2 (L) 07/14/2016   CALCIUM 8.3 (L) 02/18/2016   Lab Results  Component Value Date   VD25OH 19.84 (L) 07/14/2016   VD25OH 31.4 02/18/2016  We started vitamin D 5000 units daily.  Labs at last OV: Component     Latest Ref Rng & Units 07/14/2016  Calcium     8.6 - 10.2 mg/dL 8.2 (L)  GFR, Est  African American     >=60 mL/min >89  GFR, Est Non African American     >=60 mL/min >89  PTH     15 - 65 pg/mL 15  Vitamin D 1, 25 (OH) Total     18 - 72 pg/mL 85 (H)  Vitamin D3 1, 25 (OH)     pg/mL 70  Vitamin D2 1, 25 (OH)     pg/mL 15  VITD     30.00 - 100.00 ng/mL 19.84 (L)  Magnesium     1.5 - 2.5 mg/dL 2.1  Phosphorus     2.3 - 4.6 mg/dL 4.4  Vitamin X32     440 - 911 pg/mL 618   Calcium was low, at 8.2. Her PTH was inappropriately low. Her calcitriol level was slightly above the upper limit of normal. Phosphorus and magnesium normal. We continued the same dose of PhosLo. Her vitamin D was very low>> started 5000 units vitamin D daily. Vitamin B12 normal  She tested positive for prediabetes: - Last HbA1c was: Lab Results  Component Value Date   HGBA1C 6.1 07/14/2016   HGBA1C 6.2 (H) 02/18/2016   On Metformin 500 mg daily with dinner, now.  Meals: - Breakfast: Skips or egg sandwiches - Lunch: Salad with chicken, sandwich - Dinner: Pizza, salad, pasta, potatoes - Snacks:  1: Pack of nabs or banana  No exercise.  ROS: Constitutional: no weight gain/no weight loss, + fatigue, + subjective hyperthermia Eyes: no blurry vision, no xerophthalmia ENT: no sore throat, no nodules palpated in throat, no dysphagia, no odynophagia, no hoarseness Cardiovascular: no CP/no SOB/no palpitations/no leg swelling Respiratory: no cough/no SOB/no wheezing Gastrointestinal: + N/no V/+ D/no C/no acid reflux Musculoskeletal: no muscle aches/+ joint aches Skin: no rashes, no hair loss Neurological: no tremors/no numbness/no tingling/no dizziness  I reviewed pt's medications, allergies, PMH, social hx, family hx, and changes were documented in the history of present illness. Otherwise, unchanged from my initial visit note.   Past Medical History:  Diagnosis Date  . Hypothyroidism    Past Surgical History:  Procedure Laterality Date  . Partial thyroidectomy Completion  thyroidectomy   2009 2016  Also: - Hand surgery 1997 - C-section in 2002 - Right ovary removed in 2003 - Right ankle surgery in 2006  Social History   Social History  . Marital status: Married    Spouse name: N/A  . Number of children: 1   Occupational History  . Hair stylist (self-employed)    Social History Main Topics  . Smoking status: Former Games developermoker, Quit in 2016   . Smokeless tobacco: Never Used  . Alcohol use No  . Drug use: No   Current Outpatient Prescriptions on File Prior to Visit  Medication Sig Dispense Refill  . ALPRAZolam (XANAX) 0.5 MG tablet Take 0.5 mg by mouth at bedtime as needed for anxiety.    . calcitRIOL (ROCALTROL) 0.5 MCG capsule TAKE ONE CAPSULE BY MOUTH ONCE DAILY 30 capsule 2  . calcium acetate (PHOSLO) 667 MG capsule TAKE ONE CAPSULE BY MOUTH THREE TIMES DAILY WITH  BREAKFAST,  LUNCH,  AND  SUPPER 90 capsule 1  . metFORMIN (GLUCOPHAGE) 500 MG tablet TAKE ONE TABLET BY MOUTH ONCE DAILY 30 tablet 4  . SYNTHROID 137 MCG tablet TAKE ONE TABLET BY MOUTH DAILY BEFORE BREAKFAST 30 tablet 3   No current facility-administered medications on file prior to visit.    No Known Allergies Family History  Problem Relation Age of Onset  . Thyroid disease Father   . Cancer Maternal Grandmother   . Cancer Paternal Grandmother   . Thyroid disease Paternal Grandfather   Also history of hypertension in brother and father.  She also has a history of hypertension, heartburn.  PE: BP 122/84 (BP Location: Left Arm, Patient Position: Sitting)   Pulse 92   Wt 237 lb (107.5 kg)   LMP 10/13/2016   SpO2 97%   BMI 39.44 kg/m   Wt Readings from Last 3 Encounters:  11/12/16 237 lb (107.5 kg)  07/14/16 240 lb (108.9 kg)  02/24/16 234 lb (106.1 kg)   Constitutional: obese, in NAD Eyes: PERRLA, EOMI, no exophthalmos ENT: moist mucous membranes, cervical scar healed, no cervical lymphadenopathy Cardiovascular: RRR, No MRG Respiratory: CTA B Gastrointestinal:  abdomen soft, NT, ND, BS+ Musculoskeletal: no deformities, strength intact in all 4 Skin: moist, warm, no rashes Neurological: no tremor with outstretched hands, DTR normal in all 4   ASSESSMENT: 1.  Postsurgical Hypothyroidism  2. Postsurgical hypocalcemia  3. Prediabetes  PLAN:  1. Patient with long-standing hypothyroidism, on levothyroxine therapy.She is on Synthroid DAW 137 mcg - she feels good on this dose, but still has fatigue - latest thyroid labs reviewed with pt >> normal  - we discussed about taking the thyroid hormone every day, with water, >30 minutes before breakfast, separated  by >4 hours from acid reflux medications, calcium, iron, multivitamins. Pt. is taking it correctly - will check thyroid tests today: TSH and fT4 - If labs are abnormal, she will need to return for repeat TFTs in 1.5 months - OTW, Return in about 6 months (around 05/14/2017).  2. Postsurgical hypocalcemia - She has an extensive neck surgery history with subsequent hypoparathyroidism and hypocalcemia. She does have some parathyroid production, though, per review of labs from 02/2016 and we repeated this at last visit. Her vitamin D was low then >> we started 5000 units daily >> she feels better on this >> recheck level today along with a new calcium level - We will continue the calcitriol and the PhosLo - phos was normal at last checks, calcitriol slightly high - We may also need to add calcium tablets after results are back - no numbness in hands or acral cramping  3. Prediabetes - she stopped sweet tea since last visit >> I congratulated her! - She will continue Metformin 500 mg at dinnertime >> she does not think her diarrhea is related to this - We'll check hbA1c today  Needs refills for Synthroid  Component     Latest Ref Rng & Units 11/12/2016  Calcium     8.4 - 10.5 mg/dL 9.2  Hemoglobin Z6X     4.6 - 6.5 % 6.1  T4,Free(Direct)     0.60 - 1.60 ng/dL 0.96  TSH     0.45 - 4.09 uIU/mL  1.30  VITD     30.00 - 100.00 ng/mL 45.14   Labs are normal.  Carlus Pavlov, MD PhD [facility]

## 2016-11-13 LAB — VITAMIN D 25 HYDROXY (VIT D DEFICIENCY, FRACTURES): VITD: 45.14 ng/mL (ref 30.00–100.00)

## 2016-11-13 MED ORDER — SYNTHROID 137 MCG PO TABS: ORAL_TABLET | ORAL | 3 refills | Status: DC

## 2016-11-14 ENCOUNTER — Encounter: Payer: Self-pay | Admitting: Internal Medicine

## 2016-12-30 ENCOUNTER — Encounter: Payer: Self-pay | Admitting: Internal Medicine

## 2017-02-19 ENCOUNTER — Other Ambulatory Visit: Payer: Self-pay

## 2017-02-19 ENCOUNTER — Telehealth: Payer: Self-pay

## 2017-02-19 MED ORDER — LEVOTHYROXINE SODIUM 137 MCG PO CAPS: ORAL_CAPSULE | ORAL | 3 refills | Status: AC

## 2017-02-19 NOTE — Telephone Encounter (Signed)
Submitted the Levothyroxine into the pharmacy. Wanted you to be aware.  Thanks!

## 2017-02-19 NOTE — Telephone Encounter (Signed)
Called patient regarding the information from her pharmacy on her Synthroid medication. Advised that it needed a PA but the generic would not, and its the preferred under her insurance. LVM advising patient to call back to let me know if okay to switch to generic or if I needed to complete a PA. Gave call back number.

## 2017-02-19 NOTE — Telephone Encounter (Signed)
Would like to move forward with generic levothyroxine.  Ty,  -LL

## 2017-05-14 ENCOUNTER — Ambulatory Visit: Payer: BLUE CROSS/BLUE SHIELD | Admitting: Internal Medicine

## 2017-06-23 ENCOUNTER — Other Ambulatory Visit: Payer: Self-pay | Admitting: Internal Medicine

## 2017-07-19 LAB — HM DIABETES EYE EXAM

## 2017-07-27 ENCOUNTER — Other Ambulatory Visit: Payer: Self-pay

## 2017-07-27 MED ORDER — CALCIUM ACETATE (PHOS BINDER) 667 MG PO CAPS: ORAL_CAPSULE | ORAL | 1 refills | Status: AC

## 2017-08-09 ENCOUNTER — Ambulatory Visit: Payer: Self-pay | Admitting: Internal Medicine

## 2017-10-07 ENCOUNTER — Ambulatory Visit: Payer: Self-pay | Admitting: Internal Medicine

## 2018-11-28 LAB — HM DIABETES EYE EXAM
